# Patient Record
Sex: Female | Born: 1990 | Race: White | Hispanic: No | Marital: Single | Smoking: Never smoker
Health system: Southern US, Community
[De-identification: ages and names within clinical notes are randomized; demographics above are authoritative.]

---

## 2015-09-02 ENCOUNTER — Other Ambulatory Visit: Payer: BLUE CROSS/BLUE SHIELD | Admitting: Internal Medicine

## 2015-09-02 DIAGNOSIS — Z1321 Encounter for screening for nutritional disorder: Secondary | ICD-10-CM

## 2015-09-02 DIAGNOSIS — Z130 Encounter for screening for diseases of the blood and blood-forming organs and certain disorders involving the immune mechanism: Secondary | ICD-10-CM

## 2015-09-02 DIAGNOSIS — Z13220 Encounter for screening for lipoid disorders: Secondary | ICD-10-CM

## 2015-09-02 DIAGNOSIS — Z1329 Encounter for screening for other suspected endocrine disorder: Secondary | ICD-10-CM

## 2015-09-02 DIAGNOSIS — Z0000 Encounter for general adult medical examination without abnormal findings: Secondary | ICD-10-CM

## 2015-09-02 LAB — TSH: TSH: 1.8 mIU/L

## 2015-09-02 LAB — COMPLETE METABOLIC PANEL WITH GFR
ALT: 11 U/L (ref 6–29)
AST: 18 U/L (ref 10–30)
Albumin: 4.3 g/dL (ref 3.6–5.1)
Alkaline Phosphatase: 46 U/L (ref 33–115)
BILIRUBIN TOTAL: 0.9 mg/dL (ref 0.2–1.2)
BUN: 15 mg/dL (ref 7–25)
CHLORIDE: 103 mmol/L (ref 98–110)
CO2: 25 mmol/L (ref 20–31)
Calcium: 9.3 mg/dL (ref 8.6–10.2)
Creat: 0.87 mg/dL (ref 0.50–1.10)
GFR, Est Non African American: >89 mL/min (ref >=60)
Glucose, Bld: 83 mg/dL (ref 65–99)
Potassium: 4.2 mmol/L (ref 3.5–5.3)
Sodium: 139 mmol/L (ref 135–146)
Total Protein: 7.2 g/dL (ref 6.1–8.1)

## 2015-09-02 LAB — CBC WITH DIFFERENTIAL/PLATELET
BASOS ABS: 0 {cells}/uL (ref 0–200)
Basophils Relative: 0 %
EOS ABS: 48 {cells}/uL (ref 15–500)
Eosinophils Relative: 1 %
HCT: 40.6 % (ref 35.0–45.0)
Hemoglobin: 13.9 g/dL (ref 11.7–15.5)
LYMPHS PCT: 29 %
Lymphs Abs: 1392 cells/uL (ref 850–3900)
MCH: 30.8 pg (ref 27.0–33.0)
MCHC: 34.2 g/dL (ref 32.0–36.0)
MCV: 89.8 fL (ref 80.0–100.0)
MONOS PCT: 7 %
MPV: 9.7 fL (ref 7.5–12.5)
Monocytes Absolute: 336 cells/uL (ref 200–950)
Neutro Abs: 3024 cells/uL (ref 1500–7800)
Neutrophils Relative %: 63 %
PLATELETS: 291 10*3/uL (ref 140–400)
RBC: 4.52 MIL/uL (ref 3.80–5.10)
RDW: 12.7 % (ref 11.0–15.0)
WBC: 4.8 10*3/uL (ref 3.8–10.8)

## 2015-09-02 LAB — LIPID PANEL
Cholesterol: 169 mg/dL (ref 125–200)
HDL: 69 mg/dL (ref >=46)
LDL CALC: 90 mg/dL (ref <130)
TRIGLYCERIDES: 50 mg/dL (ref <150)
Total CHOL/HDL Ratio: 2.4 Ratio (ref <=5.0)
VLDL: 10 mg/dL (ref <30)

## 2015-09-03 LAB — VITAMIN D 25 HYDROXY (VIT D DEFICIENCY, FRACTURES): Vit D, 25-Hydroxy: 57 ng/mL (ref 30–100)

## 2015-09-23 ENCOUNTER — Encounter: Payer: Self-pay | Admitting: Internal Medicine

## 2015-09-23 ENCOUNTER — Ambulatory Visit (INDEPENDENT_AMBULATORY_CARE_PROVIDER_SITE_OTHER): Payer: BLUE CROSS/BLUE SHIELD | Admitting: Internal Medicine

## 2015-09-23 VITALS — BP 122/78 | HR 75 | Temp 97.7°F | Resp 18 | Ht 62.0 in | Wt 141.5 lb

## 2015-09-23 DIAGNOSIS — Z0000 Encounter for general adult medical examination without abnormal findings: Secondary | ICD-10-CM

## 2015-09-23 LAB — POCT URINALYSIS DIPSTICK
BILIRUBIN UA: NEGATIVE
Glucose, UA: NEGATIVE
KETONES UA: NEGATIVE
LEUKOCYTES UA: NEGATIVE
Nitrite, UA: NEGATIVE
PH UA: 7.5
Protein, UA: NEGATIVE
SPEC GRAV UA: 1.015
Urobilinogen, UA: 0.2

## 2015-10-05 ENCOUNTER — Encounter: Payer: Self-pay | Admitting: Internal Medicine

## 2015-10-05 NOTE — Patient Instructions (Signed)
It was a pleasure to see you. Return in 2 years or as needed. Continue to see GYN yearly.

## 2015-10-05 NOTE — Progress Notes (Signed)
   Subjective:    Patient ID: Heather Conway, female    DOB: 07-31-1990, 25 y.o.   MRN: [id number]  HPI  First visit for this pleasant 25 year old White Female, daughter of Heather Conway from [place] for health maintenance exam. Patient is getting married in the near future. Heather Conway is [employer]. He is a Building control surveyor. They reside in [place].  Records indicate she had Pap smear August 2016 by Dr. [name] in [place] which was negative. She is on oral contraceptives, [name]  She has had the Gardasil series given 12/11/2012, 07/30/2013, 12/09/2013. Had MMR  01/25/1993, 10/12/1993. Varicella vaccine 1999. Has had the hepatitis B series in 1994.  TSH checked in 2015 was 1.840.  Social history: Patient does not smoke. Social alcohol consumption. 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 She has a 4 year college degree. She hopes to get a Masters degree.  Family history: Father with history of allergic rhinitis, diabetes and gout. Mother in good health. One sister in good health.    Review of Systems  Intermittent  symptoms consistent with irritable bowel syndrome based on what she eats. Talked with her about management.     Objective:   Physical Exam  Constitutional: She is oriented to person, place, and time. She appears well-developed and well-nourished. No distress.  HENT:  Head: Normocephalic and atraumatic.  Right Ear: External ear normal.  Left Ear: External ear normal.  Mouth/Throat: Oropharynx is clear and moist. No oropharyngeal exudate.  Eyes: Conjunctivae and EOM are normal. Pupils are equal, round, and reactive to light. Right eye exhibits no discharge. Left eye exhibits no discharge. No scleral icterus.  Neck: Neck supple. No JVD present. No thyromegaly present.  Cardiovascular: Normal rate, regular rhythm, normal heart sounds and intact distal pulses.   No murmur heard. Pulmonary/Chest: Effort normal and breath sounds normal. No  respiratory distress. She has no rales.  Breasts normal female without masses  Abdominal: Soft. Bowel sounds are normal. She exhibits no distension and no mass. There is no tenderness. There is no rebound and no guarding.  Genitourinary:  Deferred to GYN  Musculoskeletal: She exhibits no edema.  Lymphadenopathy:    She has no cervical adenopathy.  Neurological: She is alert and oriented to person, place, and time. She has normal reflexes. No cranial nerve deficit. Coordination normal.  Skin: Skin is warm and dry. No rash noted. She is not diaphoretic.  Psychiatric: She has a normal mood and affect. Her behavior is normal. Judgment and thought content normal.  Vitals reviewed.         Assessment & Plan:    Normal health maintenance exam. Lab work reviewed with her and is within normal limits.  Plan: She is concerned she may be on menstrual period at time of honeymoon. Discussed management of oral contraceptives with her. Return in 2 years or as needed. Continue see GYN on a yearly basis.

## 2016-03-01 ENCOUNTER — Encounter: Payer: Self-pay | Admitting: Internal Medicine

## 2016-03-01 ENCOUNTER — Telehealth: Payer: Self-pay | Admitting: *Deleted

## 2016-03-01 ENCOUNTER — Ambulatory Visit (INDEPENDENT_AMBULATORY_CARE_PROVIDER_SITE_OTHER): Payer: BLUE CROSS/BLUE SHIELD | Admitting: Internal Medicine

## 2016-03-01 ENCOUNTER — Ambulatory Visit
Admission: RE | Admit: 2016-03-01 | Discharge: 2016-03-01 | Disposition: A | Discharge status: Home / Self Care | Payer: BLUE CROSS/BLUE SHIELD | Source: Ambulatory Visit | Attending: Internal Medicine | Admitting: Internal Medicine

## 2016-03-01 VITALS — BP 116/78 | HR 68 | Temp 98.7°F | Wt 154.5 lb

## 2016-03-01 DIAGNOSIS — Z0000 Encounter for general adult medical examination without abnormal findings: Secondary | ICD-10-CM | POA: Diagnosis not present

## 2016-03-01 DIAGNOSIS — M25522 Pain in left elbow: Secondary | ICD-10-CM

## 2016-03-01 DIAGNOSIS — M79622 Pain in left upper arm: Secondary | ICD-10-CM | POA: Diagnosis not present

## 2016-03-01 MED ORDER — MELOXICAM 15 MG PO TABS: 15.0000 mg | ORAL_TABLET | Freq: Every day | ORAL | 2 refills | Status: AC

## 2016-03-01 NOTE — Progress Notes (Signed)
   Subjective:    Patient ID: Heather Conway, female    DOB: [birth year], 25 y.o.   MRN: [id number]  HPI 25 year old Female who has had left upper arm pain for several weeks now. 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 By Thursday, left arm is taking quite a bit. Has had no injury. Has not been doing any heavy lifting that she's aware of. She's tried over-the-counter anti-inflammatory medication without much relief. She tried some ice as well. Ice did   seem to help temporarily.    Review of Systems     Objective:   Physical Exam She's tender over her left deltoid muscle distally. She is also tender in her sternocleidomastoid muscle on the left as well as the left medial scapular area. Deep tendon reflexes are 2+ and symmetrical. Muscle strength is 5 over 5 in the left upper extremity.       Assessment & Plan:  ? Tendinitis versus musculoskeletal pain  Plan: Meloxicam 15 mg daily. Order given for physical therapy. X-ray of left humerus. Call if not better in 4 weeks or sooner if worse. Consider orthopedic referral if not improved.  Flu vaccine given today.

## 2016-03-01 NOTE — Telephone Encounter (Signed)
LVM informing Pt.

## 2016-03-01 NOTE — Patient Instructions (Signed)
Flu vaccine given. Meloxicam 15 mg daily. Order given for physical therapy. X-ray of left hammers. Call if not better in 4 weeks or sooner if worse.

## 2016-03-01 NOTE — Telephone Encounter (Signed)
-----   Message from Margaree MackintoshMary J Baxley, MD sent at 03/01/2016  2:27 PM EDT ----- Please call her and tell her Xray is normal

## 2016-03-01 NOTE — Progress Notes (Signed)
   Subjective:    Patient ID: Rickard RhymesNancy Foust Goswami, female    DOB: [birth year], 25 y.o.   MRN: [id number]  HPI  2 month history of left upper arm pain aching sensation. Mostly everyday hurts some. 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 By Thursday is pretty painful. Has taken OTC NSAIDS without much relief.       Review of Systems     Objective:   Physical Exam        Assessment & Plan:

## 2017-05-09 ENCOUNTER — Telehealth: Payer: Self-pay | Admitting: Internal Medicine

## 2017-05-09 NOTE — Telephone Encounter (Signed)
She is able to sign up for My Chart and view these results. Please help her through this.

## 2017-05-09 NOTE — Telephone Encounter (Signed)
Patient called inquiring about X-ray results done back in 2017. [place] to [place] and now sees Dr. Zella Ballobin starting in February [facility]). Patient would like a call just to advise on the results from that X-Ray in case one is requested at her new doctor. Please call to advise.

## 2017-05-09 NOTE — Telephone Encounter (Signed)
Patient has been notified and set up for mychart. No further action required.

## 2018-01-24 IMAGING — CR DG HUMERUS 2V *L*
2 series · 2 of 2 positions shown · non-contrast
Comparison: No recent prior.

CLINICAL DATA: Possible 18 pain left tenderness.  No known injury.

EXAM:
LEFT HUMERUS - 2+ VIEW

[w humerus ap left]
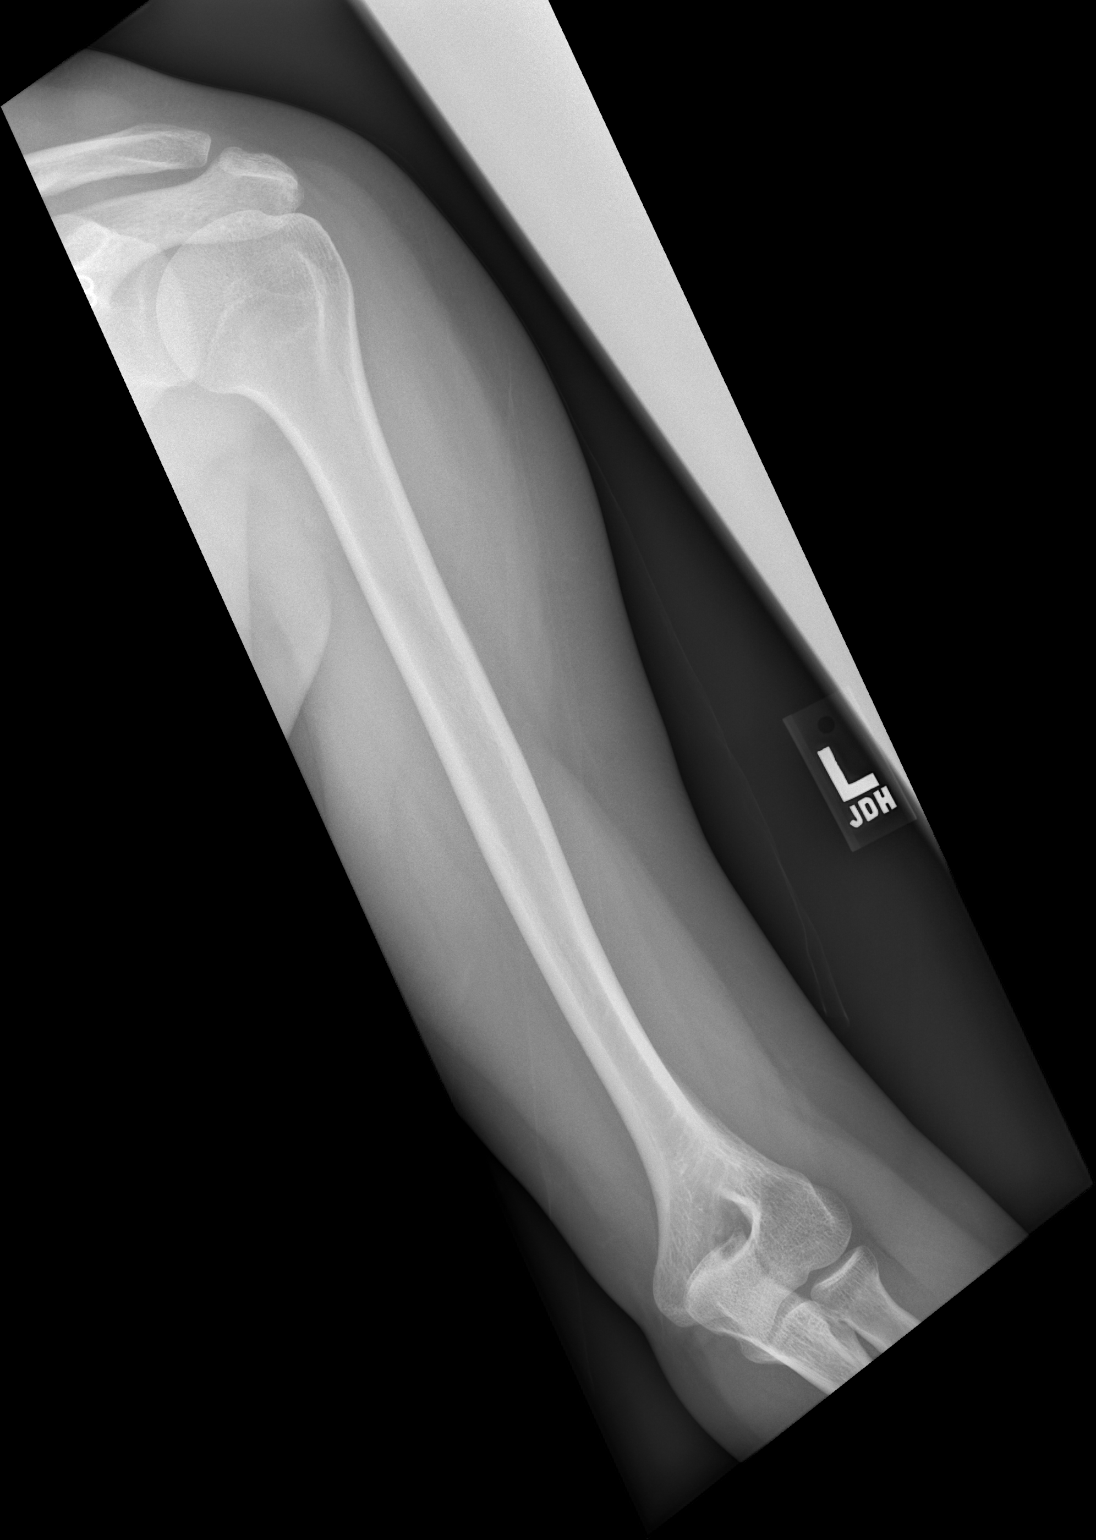

[w humerus lat left]
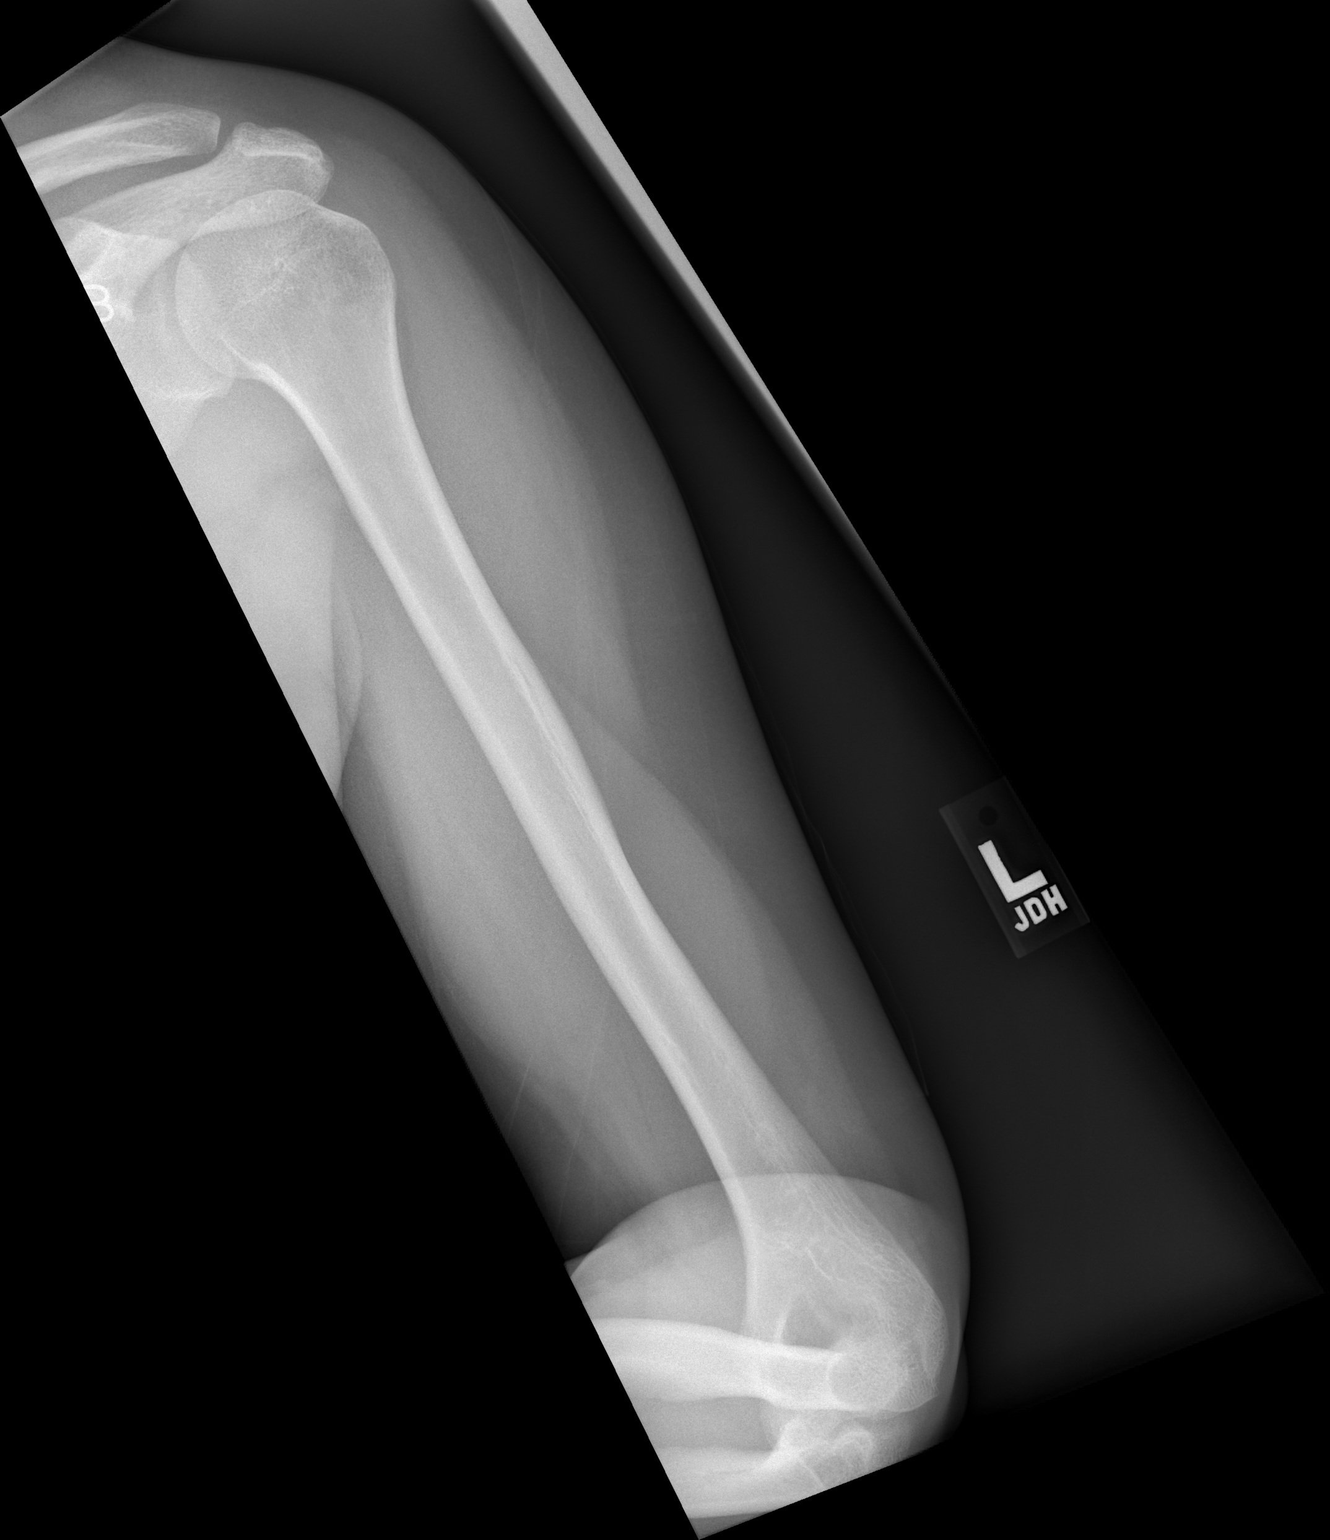

[2 of 2 positions shown; findings below may reference images not displayed]

FINDINGS: No acute bony or joint abnormality identified. No evidence of
fracture dislocation.
IMPRESSION: No acute or focal abnormality.
